# Patient Record
Sex: Male | Born: 1970 | Race: White | Hispanic: No | Marital: Married | Smoking: Never smoker
Health system: Southern US, Community
[De-identification: ages and names within clinical notes are randomized; demographics above are authoritative.]

## PROBLEM LIST (undated history)

## (undated) DIAGNOSIS — E785 Hyperlipidemia, unspecified: Secondary | ICD-10-CM

## (undated) HISTORY — DX: Hyperlipidemia, unspecified: E78.5

## (undated) HISTORY — PX: CYSTOSCOPY: SUR368

## (undated) HISTORY — PX: WISDOM TOOTH EXTRACTION: SHX21

## (undated) HISTORY — PX: TONSILLECTOMY: SUR1361

---

## 2006-09-28 ENCOUNTER — Ambulatory Visit: Payer: Self-pay | Admitting: Internal Medicine

## 2006-11-09 ENCOUNTER — Ambulatory Visit: Payer: Self-pay | Admitting: Internal Medicine

## 2006-11-09 LAB — CONVERTED CEMR LAB
Basophils Absolute: 0 10*3/uL (ref 0.0–0.1)
Basophils Relative: 0.3 % (ref 0.0–1.0)
Eosinophils Absolute: 0.2 10*3/uL (ref 0.0–0.6)
Eosinophils Relative: 3.8 % (ref 0.0–5.0)
Free T4: 0.9 ng/dL (ref 0.6–1.6)
HCT: 43.8 % (ref 39.0–52.0)
Hemoglobin: 15.2 g/dL (ref 13.0–17.0)
Lymphocytes Relative: 31.3 % (ref 12.0–46.0)
MCHC: 34.7 g/dL (ref 30.0–36.0)
MCV: 84.3 fL (ref 78.0–100.0)
Monocytes Absolute: 0.3 10*3/uL (ref 0.2–0.7)
Monocytes Relative: 7.2 % (ref 3.0–11.0)
Neutro Abs: 2.7 10*3/uL (ref 1.4–7.7)
Neutrophils Relative %: 57.4 % (ref 43.0–77.0)
Platelets: 166 10*3/uL (ref 150–400)
RBC: 5.19 M/uL (ref 4.22–5.81)
RDW: 12.6 % (ref 11.5–14.6)
T3, Free: 3.4 pg/mL (ref 2.3–4.2)
TSH: 0.92 microintl units/mL (ref 0.35–5.50)
WBC: 4.6 10*3/uL (ref 4.5–10.5)

## 2010-03-24 ENCOUNTER — Encounter: Payer: Self-pay | Admitting: Internal Medicine

## 2010-03-24 ENCOUNTER — Ambulatory Visit: Payer: Self-pay | Admitting: Internal Medicine

## 2010-03-24 DIAGNOSIS — E785 Hyperlipidemia, unspecified: Secondary | ICD-10-CM | POA: Insufficient documentation

## 2010-04-29 ENCOUNTER — Ambulatory Visit: Payer: Self-pay | Admitting: Internal Medicine

## 2010-07-12 LAB — CONVERTED CEMR LAB
ALT: 28 units/L (ref 0–40)
ALT: 29 units/L (ref 0–53)
AST: 23 units/L (ref 0–37)
AST: 24 units/L (ref 0–37)
Albumin: 4 g/dL (ref 3.5–5.2)
Albumin: 4.3 g/dL (ref 3.5–5.2)
Alkaline Phosphatase: 54 units/L (ref 39–117)
Alkaline Phosphatase: 56 units/L (ref 39–117)
BUN: 10 mg/dL (ref 6–23)
BUN: 14 mg/dL (ref 6–23)
Basophils Absolute: 0 10*3/uL (ref 0.0–0.1)
Basophils Absolute: 0 10*3/uL (ref 0.0–0.1)
Basophils Relative: 0.2 % (ref 0.0–1.0)
Basophils Relative: 0.6 % (ref 0.0–3.0)
Bilirubin, Direct: 0.1 mg/dL (ref 0.0–0.3)
Bilirubin, Direct: 0.1 mg/dL (ref 0.0–0.3)
CO2: 30 meq/L (ref 19–32)
CO2: 30 meq/L (ref 19–32)
Calcium: 9.1 mg/dL (ref 8.4–10.5)
Calcium: 9.4 mg/dL (ref 8.4–10.5)
Chloride: 103 meq/L (ref 96–112)
Chloride: 105 meq/L (ref 96–112)
Cholesterol, target level: 200 mg/dL
Creatinine, Ser: 1.1 mg/dL (ref 0.4–1.5)
Creatinine, Ser: 1.1 mg/dL (ref 0.4–1.5)
Eosinophils Absolute: 0.1 10*3/uL (ref 0.0–0.6)
Eosinophils Absolute: 0.1 10*3/uL (ref 0.0–0.7)
Eosinophils Relative: 2 % (ref 0.0–5.0)
Eosinophils Relative: 2.2 % (ref 0.0–5.0)
GFR calc Af Amer: 97 mL/min
GFR calc non Af Amer: 79.07 mL/min (ref >60)
GFR calc non Af Amer: 80 mL/min
Glucose, Bld: 91 mg/dL (ref 70–99)
Glucose, Bld: 99 mg/dL (ref 70–99)
HCT: 45 % (ref 39.0–52.0)
HCT: 45.2 % (ref 39.0–52.0)
HDL goal, serum: 40 mg/dL
Hemoglobin: 15.4 g/dL (ref 13.0–17.0)
Hemoglobin: 15.6 g/dL (ref 13.0–17.0)
LDL Goal: 160 mg/dL
Lymphocytes Relative: 26.7 % (ref 12.0–46.0)
Lymphocytes Relative: 27.7 % (ref 12.0–46.0)
Lymphs Abs: 1.3 10*3/uL (ref 0.7–4.0)
MCHC: 34.1 g/dL (ref 30.0–36.0)
MCHC: 34.5 g/dL (ref 30.0–36.0)
MCV: 86.5 fL (ref 78.0–100.0)
MCV: 87.5 fL (ref 78.0–100.0)
Monocytes Absolute: 0.3 10*3/uL (ref 0.1–1.0)
Monocytes Absolute: 0.3 10*3/uL (ref 0.2–0.7)
Monocytes Relative: 6.1 % (ref 3.0–12.0)
Monocytes Relative: 6.5 % (ref 3.0–11.0)
Neutro Abs: 3.2 10*3/uL (ref 1.4–7.7)
Neutro Abs: 3.4 10*3/uL (ref 1.4–7.7)
Neutrophils Relative %: 63.4 % (ref 43.0–77.0)
Neutrophils Relative %: 64.6 % (ref 43.0–77.0)
Platelets: 155 10*3/uL (ref 150.0–400.0)
Platelets: 158 10*3/uL (ref 150–400)
Potassium: 3.9 meq/L (ref 3.5–5.1)
Potassium: 4 meq/L (ref 3.5–5.1)
RBC: 5.17 M/uL (ref 4.22–5.81)
RBC: 5.2 M/uL (ref 4.22–5.81)
RDW: 12.9 % (ref 11.5–14.6)
RDW: 13.8 % (ref 11.5–14.6)
Sodium: 138 meq/L (ref 135–145)
Sodium: 141 meq/L (ref 135–145)
TSH: 0.24 microintl units/mL — ABNORMAL LOW (ref 0.35–5.50)
TSH: 0.91 microintl units/mL (ref 0.35–5.50)
Total Bilirubin: 0.8 mg/dL (ref 0.3–1.2)
Total Bilirubin: 1 mg/dL (ref 0.3–1.2)
Total Protein: 6.9 g/dL (ref 6.0–8.3)
Total Protein: 7.1 g/dL (ref 6.0–8.3)
WBC: 5 10*3/uL (ref 4.5–10.5)
WBC: 5.3 10*3/uL (ref 4.5–10.5)

## 2010-07-14 NOTE — Assessment & Plan Note (Signed)
Summary: CPX/FASTING//KN   Vital Signs:  Patient profile:   40 year old male Height:      77.5 inches Weight:      217.0 pounds BMI:     25.49 Temp:     98.2 degrees F oral Pulse rate:   76 / minute Resp:     14 per minute BP sitting:   126 / 80  (left arm) Cuff size:   large  Vitals Entered By: Shonna Chock CMA (March 24, 2010 8:40 AM)  CC: Lipid Management   CC:  Lipid Management.  History of Present Illness: Jon Byrd is here for a physical; he is asymptomatic.  Lipid Management History:      Negative NCEP/ATP III risk factors include male age less than 70 years old, non-diabetic, no family history for ischemic heart disease, non-tobacco-user status, non-hypertensive, no ASHD (atherosclerotic heart disease), no prior stroke/TIA, no peripheral vascular disease, and no history of aortic aneurysm.     Preventive Screening-Counseling & Management  Caffeine-Diet-Exercise     Does Patient Exercise: no  Current Medications (verified): 1)  None  Allergies (verified): No Known Drug Allergies  Past History:  Past Medical History: Amblyopia Dyslipidemia: [place] Study LDL goal = < 160.NMR Lipoprofile 2008: LDL 146(2086/1804), HDL 30, TG 149. LDL goal = < 100.  Past Surgical History: Cystoscopy age 73 for hematuria; Oral Surgery ([facility]); Tonsillectomy  Family History: Father: Dyslipidemia, +/- HTN, colon polyps Mother: hypothyroidism, OA, dyslipidemia Siblings: negative; MGF : CVA; PGF : MI after 75; PGM : pancreatic cancer; MGM : bladder cancer  Social History: Never Smoked Alcohol use-yes-occasionally Married Regular exercise-no Does Patient Exercise:  no  Review of Systems  The patient denies anorexia, fever, vision loss, decreased hearing, hoarseness, chest pain, syncope, dyspnea on exertion, peripheral edema, prolonged cough, headaches, hemoptysis, abdominal pain, melena, hematochezia, severe indigestion/heartburn, hematuria, suspicious skin  lesions, depression, unusual weight change, abnormal bleeding, enlarged lymph nodes, and angioedema.         Mi9nimal weight change of 5# in past year. Loose stool better with decreased dairy.  Physical Exam  General:  well-nourished; alert,appropriate and cooperative throughout examination Head:  Normocephalic and atraumatic without obvious abnormalities. No apparent alopecia. Eyes:  No corneal or conjunctival inflammation noted.Perrla. Funduscopic exam benign, without hemorrhages, exudates or papilledema.  Ears:  External ear exam shows no significant lesions or deformities.  Otoscopic examination reveals clear canals, tympanic membranes are intact bilaterally without bulging, retraction, inflammation or discharge. Hearing is grossly normal bilaterally. Nose:  External nasal examination shows no deformity or inflammation. Nasal mucosa are pink and moist without lesions or exudates. Mouth:  Oral mucosa and oropharynx without lesions or exudates.  Teeth in good repair. Neck:  No deformities, masses, or tenderness noted. Lungs:  Normal respiratory effort, chest expands symmetrically. Lungs are clear to auscultation, no crackles or wheezes. Heart:  Normal rate and regular rhythm. S1 and S2 normal without gallop, murmur, click, rub . Loud S4 @ apex Abdomen:  Bowel sounds positive,abdomen soft and non-tender without masses, organomegaly or hernias noted. Rectal:  No external abnormalities noted. Normal sphincter tone. No rectal masses or tenderness. Genitalia:  Testes bilaterally descended without nodularity, tenderness or masses. No scrotal masses or lesions. No penis lesions or urethral discharge. Prostate:  Prostate gland firm and smooth, no enlargement, nodularity, tenderness, mass, asymmetry or induration. Msk:  No deformity or scoliosis noted of thoracic or lumbar spine.   Pulses:  R and L carotid,radial,dorsalis pedis and posterior tibial pulses are  full and equal bilaterally Extremities:  No  clubbing, cyanosis, edema, or deformity noted with normal full range of motion of all joints.   Neurologic:  alert & oriented X3 and DTRs symmetrical and normal.   Skin:  4X 4 mm granuloma LLE above ankle Cervical Nodes:  No lymphadenopathy noted Axillary Nodes:  No palpable lymphadenopathy Psych:  memory intact for recent and remote, normally interactive, and good eye contact.     Impression & Recommendations:  Problem # 1:  ROUTINE GENERAL MEDICAL EXAM@HEALTH  CARE FACL (ICD-V70.0)  Orders: EKG w/ Interpretation (93000) Venipuncture (60454) TLB-BMP (Basic Metabolic Panel-BMET) (80048-METABOL) TLB-CBC Platelet - w/Differential (85025-CBCD) TLB-Hepatic/Liver Function Pnl (80076-HEPATIC) TLB-TSH (Thyroid Stimulating Hormone) (84443-TSH) T- * Misc. Laboratory test [phone])  Problem # 2:  HYPERLIPIDEMIA (ICD-272.4)  Orders: T- * Misc. Laboratory test [phone])  Problem # 3:  CORONARY ARTERY DISEASE, FAMILY HX (ICD-V17.3)  Orders: T- * Misc. Laboratory test [phone])  Other Orders: Admin 1st Vaccine (13086) Flu Vaccine 66yrs + [phone])  Lipid Assessment/Plan:      Based on NCEP/ATP III, the patient's risk factor category is "0-1 risk factors".  The patient's lipid goals are as follows: Total cholesterol goal is 200; LDL cholesterol goal is 160; HDL cholesterol goal is 40; Triglyceride goal is 150.   Flu Vaccine Consent Questions     Do you have a history of severe allergic reactions to this vaccine? no    Any prior history of allergic reactions to egg and/or gelatin? no    Do you have a sensitivity to the preservative Thimersol? no    Do you have a past history of Guillan-Barre Syndrome? no    Do you currently have an acute febrile illness? no    Have you ever had a severe reaction to latex? no    Vaccine information given and explained to patient? yes    Are you currently pregnant? no    Lot Number:[id number]   Exp Date:12/12/2010   Site Given  Left Deltoid IMccine 1yrs +  (96295)  Patient Instructions: 1)  It is important that you exercise regularly at least 20 minutes 5 times a week. If you develop chest pain, have severe difficulty breathing, or feel very tired , stop exercising immediately and seek medical attention. 2)  Take an  81 mg coated Aspirin every day. .lbflu    Appended Document: CPX/FASTING//KN   Appended Document: CPX/FASTING//KN

## 2010-07-14 NOTE — Assessment & Plan Note (Signed)
Summary: BRONCHITIS/KN   Vital Signs:  Patient profile:   40 year old male Weight:      218.2 pounds O2 Sat:      97 % on Room air Temp:     97.9 degrees F oral Pulse rate:   78 / minute Pulse rhythm:   regular Resp:     15 per minute BP sitting:   108 / 72  (right arm) Cuff size:   large  Vitals Entered By: Almeta Monas CMA Duncan Dull) (April 29, 2010 1:46 PM)  O2 Flow:  Room air CC: x10 days c/o congestion, hoarseness, sinus drainage, and green sputum x6 days c/o cough, URI symptoms   CC:  x10 days c/o congestion, hoarseness, sinus drainage, and green sputum x6 days c/o cough, and URI symptoms.  History of Present Illness:  RTI Symptoms      This is a 40 year old man who presents with RTI  symptoms X 10 days, initially as sinus congestion..  Now the patient reports productive cough, but denies nasal congestion, purulent nasal discharge, and earache.  The patient denies fever, dyspnea, and wheezing.  The patient denies headache.  The patient denies the following risk factors for Strep sinusitis: unilateral facial pain, tooth pain, and tender adenopathy.    Current Medications (verified): 1)  Zyrtec-D Allergy & Congestion 5-120 Mg Xr12h-Tab (Cetirizine-Pseudoephedrine) .Marland Kitchen.. 1 By Mouth Once Daily  Allergies (verified): No Known Drug Allergies  Physical Exam  General:  in no acute distress; alert,appropriate and cooperative throughout examination Ears:  External ear exam shows no significant lesions or deformities.  Otoscopic examination reveals clear canals, tympanic membranes are intact bilaterally without bulging, retraction, inflammation or discharge. Hearing is grossly normal bilaterally.Minor TM scarring Nose:  External nasal examination shows no deformity or inflammation. Nasal mucosa are pink and moist without lesions or exudates. Minimal septal deviation Mouth:  Oral mucosa and oropharynx without lesions or exudates.  Teeth in good repair.  Lungs:  Normal respiratory  effort, chest expands symmetrically. Lungs are clear to auscultation, no crackles or wheezes. Brassy cough Cervical Nodes:  No lymphadenopathy noted Axillary Nodes:  No palpable lymphadenopathy   Impression & Recommendations:  Problem # 1:  BRONCHITIS-ACUTE (ICD-466.0)  His updated medication list for this problem includes:    Zyrtec-d Allergy & Congestion 5-120 Mg Xr12h-tab (Cetirizine-pseudoephedrine) .Marland Kitchen... 1 by mouth once daily    Azithromycin 250 Mg Tabs (Azithromycin) .Marland Kitchen... As per pack    Hydromet 5-1.5 Mg/71ml Syrp (Hydrocodone-homatropine) .Marland Kitchen... 1  tsp every 6 hrs as needed  Complete Medication List: 1)  Zyrtec-d Allergy & Congestion 5-120 Mg Xr12h-tab (Cetirizine-pseudoephedrine) .Marland Kitchen.. 1 by mouth once daily 2)  Azithromycin 250 Mg Tabs (Azithromycin) .... As per pack 3)  Hydromet 5-1.5 Mg/77ml Syrp (Hydrocodone-homatropine) .Marland Kitchen.. 1  tsp every 6 hrs as needed  Patient Instructions: 1)  Neti pot once daily as needed for congestion.  2)  Drink as much NON dairy fluid as you can tolerate for the next few days.Consume < 40 grams of HFCS sugar/ day as discussed. 3)  Please schedule a follow-up appointment in 4 months. 4)  Lipid Panel prior to visit, ICD-9:272.4. 5)  It is important that you exercise regularly at least 20 minutes 5 times a week. If you develop chest pain, have severe difficulty breathing, or feel very tired , stop exercising immediately and seek medical attention. Prescriptions: HYDROMET 5-1.5 MG/5ML SYRP (HYDROCODONE-HOMATROPINE) 1  tsp every 6 hrs as needed  #120 cc x 0   Entered and  Authorized by:   Marga Melnick MD   Signed by:   Marga Melnick MD on 04/29/2010   Method used:   Printed then faxed to ...       [facility] [address] [phone]* (retail)       [address]       [place], [place]  [address]       Ph: [phone] or [phone]       Fax: [phone]   RxID:   [id number] AZITHROMYCIN 250 MG TABS (AZITHROMYCIN) as per pack  #1 x  0   Entered and Authorized by:   Marga Melnick MD   Signed by:   Marga Melnick MD on 04/29/2010   Method used:   Printed then faxed to ...       [facility] [address] [phone]* (retail)       [address]       [place], [place]  [address]       Ph: [phone] or [phone]       Fax: [phone]   RxID:   [id number]    Orders Added: 1)  Est. Patient Level III [93810]

## 2010-10-30 NOTE — Assessment & Plan Note (Signed)
[facility]                        [facility] OFFICE NOTE   PARRY, PO                         MRN:          [id number]  DATE:09/28/2006                            DOB:          03/29/1971    Jon Byrd, date of birth [birth year], was seen for a  comprehensive physical examination on September 28, 2006.   He is essentially asymptomatic.   He will have occasional vertigo upon arising from bed for which he  takes decongestants and antihistamines with relief.  His most recent  episode was 1 month ago.   PAST HISTORY:  1. Tonsillectomy.  2. He had an apparent cystoscopy at age 70 in [place] for hematuria.      He was reevaluated by Dr. Mickel Crow, urologist in [place].  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   OTHER MEDICAL PROBLEMS:  1. Amblyopia.  2. Dyslipidemia.   FAMILY HISTORY:  His paternal grandmother has liver cancer.  Father had  hypertension, heart attack and stroke.  Brother had hypertension.  Paternal grandfather had a heart attack.  Maternal grandfather had a  heart attack.  Maternal grandmother had a stroke.  His grandfathers had  coronary artery disease in their 17s or 53s.   SOCIAL HISTORY:  He has never smoked.  He drinks occasionally.  He is  not exercising, and he is on no diet.   MEDICATIONS:  Presently, he is on no prescription medicines.   ALLERGIES:  He has no known drug allergies.   REVIEW OF SYSTEMS:  Also include possible perforation of the eardrum  related to infection as a child.  He has occasional dyspepsia for which  he will take Pepcid or Zantac with relief.  Nasal congestion usually  related to barometric changes.  He is essentially asymptomatic but is  considering starting an exercise program and wanted to establish a  baseline prior to initiating that.   PHYSICAL EXAMINATION:  VITAL SIGNS:  He is 6 feet, 4 inches.  Weighs 208  pounds, which is up approximately 9  pounds.  Temperature is 97.6, pulse  76, respiratory rate 16 and blood pressure 120/84.  HEENT:  Pupils are dilated with good visualization of the fundus.  No  significant vascular changes.  Nares and otic canals are patent.  There  is some mild swelling of the left tympanic membranes inferiorly, but no  perforations.  Oral hygiene is excellent.  Air conduction is greater  than bone conduction, and there is no lateralization with the tuning  fork.  LYMPHATICS:  He has no lymphadenopathy of his head, neck or axilla.  NECK:  The thyroid is normal to palpation.  HEART:  There is a suggestion of a click at the apex with no significant  murmur and no regurgitation or dysrhythmia.  CHEST:  Clear to auscultation.  ABDOMEN:  He has no organomegaly or masses.  GENITOURINARY:  Unremarkable.  Prostate normal to palpation.  Hemoccult  testing is negative.  MUSCULOSKELETAL:  He has full range of motion with no musculoskeletal  findings.  NEUROPSYCHIATRIC:  Also unremarkable.   Review of the chart reveals that his LDL was 146 in 2003.  I will  recommend an NMR LipoProfile to optimally assess his long-term risk.  I  feel this is indicated because of his relatively young age and strong  family history.   His EKG was normal and based on the exam and EKG, I see no  contraindication to exercise.   Further recommendations are pending return of the labs.     Jon Dubin. Alwyn Ren, MD,FACP,FCCP  Electronically Signed    [facility]  DD: 09/28/2006  DT: 09/28/2006  Job #: [id number]

## 2012-01-19 ENCOUNTER — Ambulatory Visit (INDEPENDENT_AMBULATORY_CARE_PROVIDER_SITE_OTHER): Payer: 59 | Admitting: Internal Medicine

## 2012-01-19 ENCOUNTER — Encounter: Payer: Self-pay | Admitting: Internal Medicine

## 2012-01-19 VITALS — BP 118/86 | HR 85 | Temp 98.2°F | Wt 220.6 lb

## 2012-01-19 DIAGNOSIS — L0390 Cellulitis, unspecified: Secondary | ICD-10-CM

## 2012-01-19 DIAGNOSIS — L0291 Cutaneous abscess, unspecified: Secondary | ICD-10-CM

## 2012-01-19 DIAGNOSIS — IMO0002 Reserved for concepts with insufficient information to code with codable children: Secondary | ICD-10-CM

## 2012-01-19 DIAGNOSIS — M5414 Radiculopathy, thoracic region: Secondary | ICD-10-CM

## 2012-01-19 NOTE — Patient Instructions (Addendum)
Use warm moist compresses to 3 times a day to the affected area. The best exercises for the low back include freestyle swimming, stretch aerobics, and yoga.

## 2012-01-19 NOTE — Progress Notes (Signed)
  Subjective:    Patient ID: Jon Byrd, male    DOB: Jun 24, 1970, 41 y.o.   MRN: [id number]  HPI He noted some itching in both axillae after using an old deodorant to which he had a mild reaction in the past. On 02/12/12 he noted swelling, tenderness, and erythema over a area in the right axilla. This has improved without definitive treatment. He denies fever, chills, sweats, weight loss. Other than erythema there's been no localized rash.    Review of Systems Additionally has noted discomfort in the right scapular area over the last 6 months. He originally attributed this to strain or muscle injury related to exercise. It has improved; now occurs with position change. Specifically it occurs if he rolls from the prone or the right lateral decubitus positions . He does not have this in the left lateral decubitus position. There has been no associated extremity numbness, tingling or weakness. He's had no fecal or urinary incontinence.        Objective:   Physical Exam He appears healthy and well-nourished in no distress  There is a very small 4-5 mm subcutaneous structure in the right axilla superiorly. There is no other lymphadenopathy about the neck or axilla.  Thyroid is normal to palpation. Physiologic asymmetry is present without nodularity.  He has no organomegaly or masses  There is mild asymmetry of the mid thoracic paraspinous musculature, right larger than left suggesting occult scoliosis  Strength, tone, deep tendon reflexes are normal.  Straight leg raising is  negative. He is able to slide back and sit up without help  There is no discomfort to compression or percussion of the thoracic spine.  There no suspicious skin lesions or rashes        Assessment & Plan:  #1 plugging of the pores due  to reaction to deodorant. Cellulitis resolving  #2 thoracic radiculopathy T-5 positionally in the context of scoliosis  Plan: See orders and recommendations

## 2012-06-01 ENCOUNTER — Telehealth: Payer: Self-pay | Admitting: Internal Medicine

## 2012-06-01 NOTE — Telephone Encounter (Signed)
Patient Information:  Caller Name: Jorja Loa  Phone: [phone]  Patient: Jon Byrd, Jon Byrd  Gender: Male  DOB: 1970/06/22  Age: 41 Years  PCP: Marga Melnick  Office Follow Up:  Does the office need to follow up with this patient?: Yes  Instructions For The Office: Patient would like to be seen today for cough and congestion/  Please contact   Symptoms  Reason For Call & Symptoms: Patient states onet of illness on Turesday 05/30/12.  +Fever , cough and drainage. Temperature 100.0 (o),  +cough productive, +nasal drainage clear- green  Reviewed Health History In EMR: Yes  Reviewed Medications In EMR: Yes  Reviewed Allergies In EMR: Yes  Reviewed Surgeries / Procedures: No  Date of Onset of Symptoms: 05/30/2012  Treatments Tried: Tylenol and Sudafed  Treatments Tried Worked: Yes  Any Fever: Yes  Fever Taken: Oral  Fever Time Of Reading: 07:00:00  Fever Last Reading: 100.0  Guideline(s) Used:  Colds  Disposition Per Guideline:   See Today or Tomorrow in Office  Reason For Disposition Reached:   Patient wants to be seen  Advice Given:  For a Stuffy Nose - Use Nasal Washes:  Introduction: Saline (salt water) nasal irrigation (nasal wash) is an effective and simple home remedy for treating stuffy nose and sinus congestion. The nose can be irrigated by pouring, spraying, or squirting salt water into the nose and then letting it run back out.  How it Helps: The salt water rinses out excess mucus, washes out any irritants (dust, allergens) that might be present, and moistens the nasal cavity.  Methods: There are several ways to perform nasal irrigation. You can use a saline nasal spray bottle (available over-the-counter), a rubber ear syringe, a medical syringe without the needle, or a Neti Pot.  Treatment for Associated Symptoms of Colds:  For muscle aches, headaches, or moderate fever (more than 101 F or 38.9 C): Take acetaminophen every 4 hours.  Cough: Use cough drops.  Hydrate:  Drink adequate liquids.  Humidifier:  If the air in your home is dry, use a cool-mist humidifier  Call Back If:  Difficulty breathing occurs  Fever lasts more than 3 days  Nasal discharge lasts more than 10 days  Cough lasts more than 3 weeks  You become worse  PATIENT REQUEST APPT FOR EVALUATION.

## 2012-06-01 NOTE — Telephone Encounter (Signed)
Patient called stating he needed an appointment with Dr. Alwyn Ren today. I advised pt that Dr. Alwyn Ren had no available appts, but Dr. Beverely Low could possibly see him at 4:15p. He stated that was too late. I then offered to schedule him at our [facility] location, pt refused that as well. Pt stated he thinks his sickness is viral and he probably will not be seen for it. I advised pt to call the office if he changed his mind and that we could get him in to see someone at another office today.

## 2012-06-01 NOTE — Telephone Encounter (Signed)
I can come back @ 1:45 pm for Dr Sharlot Gowda

## 2013-06-14 HISTORY — PX: VASECTOMY: SHX75

## 2015-01-08 ENCOUNTER — Encounter: Payer: Self-pay | Admitting: Internal Medicine

## 2015-01-08 ENCOUNTER — Ambulatory Visit (INDEPENDENT_AMBULATORY_CARE_PROVIDER_SITE_OTHER): Payer: 59 | Admitting: Internal Medicine

## 2015-01-08 VITALS — BP 124/82 | HR 95 | Temp 98.1°F | Resp 14 | Ht 76.0 in | Wt 226.0 lb

## 2015-01-08 DIAGNOSIS — E785 Hyperlipidemia, unspecified: Secondary | ICD-10-CM

## 2015-01-08 DIAGNOSIS — Z0189 Encounter for other specified special examinations: Secondary | ICD-10-CM | POA: Diagnosis not present

## 2015-01-08 DIAGNOSIS — Z23 Encounter for immunization: Secondary | ICD-10-CM | POA: Diagnosis not present

## 2015-01-08 DIAGNOSIS — Z0000 Encounter for general adult medical examination without abnormal findings: Secondary | ICD-10-CM

## 2015-01-08 NOTE — Progress Notes (Signed)
Pre visit review using our clinic review tool, if applicable. No additional management support is needed unless otherwise documented below in the visit note. 

## 2015-01-08 NOTE — Patient Instructions (Addendum)
  Your next office appointment will be determined based upon review of your pending labs  and  xrays  Those written interpretation of the lab results and instructions will be transmitted to you by My Chart OR by mail for your records.  Critical results will be called.   Followup as needed for any active or acute issue. Please report any significant change in your symptoms. 

## 2015-01-08 NOTE — Progress Notes (Signed)
Subjective:    Patient ID: Jon Byrd, male    DOB: 1971/01/04, 44 y.o.   MRN: [id number]  HPI He is here for a physical;acute issues denied.  He does eat some fried foods and red meat. He restricts salt. He works out on [employer] 3 times a week 30-60 minutes without cardio pulmonary symptoms.  He has negative review of symptoms.  He did have a vasectomy and is being monitored for sperm counts by Dr Retta Diones.  He's concerned that there is a strong family history of gallbladder disease among his mother and 3 brothers.   Review of Systems  Chest pain, palpitations, tachycardia, exertional dyspnea, paroxysmal nocturnal dyspnea, claudication or edema are absent. No unexplained weight loss, abdominal pain, significant dyspepsia, dysphagia, melena, rectal bleeding, or persistently small caliber stools. Dysuria, pyuria, hematuria, frequency, nocturia or polyuria are denied. Change in hair, skin, nails denied. No bowel changes of constipation or diarrhea. No intolerance to heat or cold.     Objective:   Physical Exam  First heart sound is accentuated. He has minor crepitus of the knees. Genitourinary exam was completed by his Urologist.  Gen.: Adequately nourished in appearance. Alert, appropriate and cooperative throughout exam. BMI:27.52 Appears younger than stated age  Head: Normocephalic without obvious abnormalities  Eyes: No corneal or conjunctival inflammation noted. Pupils equal round reactive to light and accommodation. Extraocular motion intact.  Ears: External  ear exam reveals no significant lesions or deformities. Canals clear .TMs normal. Hearing is grossly normal bilaterally. Nose: External nasal exam reveals no deformity or inflammation. Nasal mucosa are pink and moist. No lesions or exudates noted.   Mouth: Oral mucosa and oropharynx reveal no lesions or exudates. Teeth in good repair. Neck: No deformities, masses, or tenderness noted. Range of motion and  Thyroid normal. Lungs: Normal respiratory effort; chest expands symmetrically. Lungs are clear to auscultation without rales, wheezes, or increased work of breathing. Heart: Normal rate and rhythm. Normal S2. No gallop, click, or rub. No murmur. Abdomen: Bowel sounds normal; abdomen soft and nontender. No masses, organomegaly or hernias noted.                               Musculoskeletal/extremities: No deformity or scoliosis noted of  the thoracic or lumbar spine.  No clubbing, cyanosis, edema, or significant extremity  deformity noted.  Range of motion normal . Tone & strength normal. Hand joints normal  Fingernail  health good. Able to lie down & sit up w/o help.  Negative SLR bilaterally Vascular: Carotid, radial artery, dorsalis pedis and  posterior tibial pulses are full and equal. No bruits present. Neurologic: Alert and oriented x3. Deep tendon reflexes symmetrical and normal.  Gait normal      Skin: Intact without suspicious lesions or rashes. Lymph: No cervical, axillary lymphadenopathy present. Psych: Mood and affect are normal. Normally interactive                                                                                      Assessment & Plan:  #1 comprehensive physical exam; no acute findings  Plan: see  Orders  & Recommendations

## 2015-02-05 ENCOUNTER — Other Ambulatory Visit (INDEPENDENT_AMBULATORY_CARE_PROVIDER_SITE_OTHER): Payer: 59

## 2015-02-05 ENCOUNTER — Other Ambulatory Visit: Payer: Self-pay | Admitting: Internal Medicine

## 2015-02-05 DIAGNOSIS — Z0189 Encounter for other specified special examinations: Secondary | ICD-10-CM

## 2015-02-05 DIAGNOSIS — Z0000 Encounter for general adult medical examination without abnormal findings: Secondary | ICD-10-CM

## 2015-02-05 LAB — CBC WITH DIFFERENTIAL/PLATELET
Basophils Absolute: 0 10*3/uL (ref 0.0–0.1)
Basophils Relative: 0.7 % (ref 0.0–3.0)
Eosinophils Absolute: 0.1 10*3/uL (ref 0.0–0.7)
Eosinophils Relative: 2.4 % (ref 0.0–5.0)
HCT: 45.4 % (ref 39.0–52.0)
Hemoglobin: 15.7 g/dL (ref 13.0–17.0)
Lymphocytes Relative: 33.9 % (ref 12.0–46.0)
Lymphs Abs: 1.6 10*3/uL (ref 0.7–4.0)
MCHC: 34.5 g/dL (ref 30.0–36.0)
MCV: 85.3 fl (ref 78.0–100.0)
Monocytes Absolute: 0.3 10*3/uL (ref 0.1–1.0)
Monocytes Relative: 6.5 % (ref 3.0–12.0)
Neutro Abs: 2.7 10*3/uL (ref 1.4–7.7)
Neutrophils Relative %: 56.5 % (ref 43.0–77.0)
Platelets: 173 10*3/uL (ref 150.0–400.0)
RBC: 5.32 Mil/uL (ref 4.22–5.81)
RDW: 13.1 % (ref 11.5–15.5)
WBC: 4.8 10*3/uL (ref 4.0–10.5)

## 2015-02-05 LAB — BASIC METABOLIC PANEL
BUN: 14 mg/dL (ref 6–23)
CO2: 29 mEq/L (ref 19–32)
Calcium: 9.4 mg/dL (ref 8.4–10.5)
Chloride: 104 mEq/L (ref 96–112)
Creatinine, Ser: 1.1 mg/dL (ref 0.40–1.50)
GFR: 76.49 mL/min (ref >60.00)
Glucose, Bld: 98 mg/dL (ref 70–99)
Potassium: 4.1 mEq/L (ref 3.5–5.1)
Sodium: 140 mEq/L (ref 135–145)

## 2015-02-05 LAB — HEPATIC FUNCTION PANEL
ALT: 25 U/L (ref 0–53)
AST: 18 U/L (ref 0–37)
Albumin: 4.4 g/dL (ref 3.5–5.2)
Alkaline Phosphatase: 52 U/L (ref 39–117)
Bilirubin, Direct: 0.1 mg/dL (ref 0.0–0.3)
Total Bilirubin: 0.9 mg/dL (ref 0.2–1.2)
Total Protein: 7.3 g/dL (ref 6.0–8.3)

## 2015-02-05 LAB — TSH: TSH: 0.79 u[IU]/mL (ref 0.35–4.50)

## 2015-02-07 LAB — NMR LIPOPROFILE WITH LIPIDS
Cholesterol, Total: 214 mg/dL — ABNORMAL HIGH (ref 100–199)
HDL Particle Number: 17.5 umol/L — ABNORMAL LOW (ref >=30.5)
HDL Size: 8 nm — ABNORMAL LOW (ref >=9.2)
HDL-C: 28 mg/dL — ABNORMAL LOW (ref >39)
LDL (calc): 150 mg/dL — ABNORMAL HIGH (ref 0–99)
LDL Particle Number: 2488 nmol/L — ABNORMAL HIGH (ref <1000)
LDL Size: 20.1 nm (ref >=20.8)
LP-IR Score: 51 — ABNORMAL HIGH (ref <=45)
Large HDL-P: <1.3 umol/L — ABNORMAL LOW (ref >=4.8)
Large VLDL-P: <0.8 nmol/L (ref <=2.7)
Small LDL Particle Number: 1753 nmol/L — ABNORMAL HIGH (ref <=527)
Triglycerides: 181 mg/dL — ABNORMAL HIGH (ref 0–149)
VLDL Size: 43 nm (ref <=46.6)

## 2020-04-30 ENCOUNTER — Other Ambulatory Visit: Payer: Self-pay | Admitting: Cardiology

## 2020-04-30 ENCOUNTER — Telehealth: Payer: Self-pay | Admitting: *Deleted

## 2020-04-30 DIAGNOSIS — E782 Mixed hyperlipidemia: Secondary | ICD-10-CM

## 2020-04-30 NOTE — Telephone Encounter (Signed)
Left the pt a message to call the office back and request to speak with a triage nurse, so that they can further assist him with getting his new pt appt with Dr. Anne Fu, as well as order for the pt to have a Coronary Calcium Score to be done in the office, as advised in this message by Dr. Anne Fu.  Will forward this message to triage to assist the pt with these appointments, when he returns a call back.

## 2020-04-30 NOTE — Telephone Encounter (Signed)
-----   Message from Jake Bathe, MD sent at 04/30/2020  6:46 AM EST ----- Regarding: Please set up new appt with me and Calcium score Aram Beecham or Lajoyce Corners,  Can you set Dr. Sharlot Gowda (he is my optometrist--saw him yesterday) with an appointment.  His brother, age 51, recently had a heart attack, successful PCI.  I talked to him about obtaining a coronary calcium score given his strong family history.  Thanks.  Loraine Leriche

## 2020-04-30 NOTE — Telephone Encounter (Signed)
Patient returning call.

## 2020-04-30 NOTE — Telephone Encounter (Signed)
Spoke with patient and confirmed coronary calcium score CT and new patient appointment for 05/21/20.   Georgie Chard NP-C [facility] Pager: [phone]

## 2020-05-02 ENCOUNTER — Inpatient Hospital Stay: Admission: RE | Admit: 2020-05-02 | Payer: Self-pay | Source: Ambulatory Visit

## 2020-05-21 ENCOUNTER — Encounter: Payer: Self-pay | Admitting: Cardiology

## 2020-05-21 ENCOUNTER — Other Ambulatory Visit: Payer: Self-pay

## 2020-05-21 ENCOUNTER — Ambulatory Visit (INDEPENDENT_AMBULATORY_CARE_PROVIDER_SITE_OTHER)
Admission: RE | Admit: 2020-05-21 | Discharge: 2020-05-21 | Disposition: A | Discharge status: Home / Self Care | Payer: Self-pay | Source: Ambulatory Visit | Attending: Cardiology | Admitting: Cardiology

## 2020-05-21 ENCOUNTER — Ambulatory Visit: Payer: 59 | Admitting: Cardiology

## 2020-05-21 ENCOUNTER — Telehealth: Payer: Self-pay | Admitting: Nurse Practitioner

## 2020-05-21 VITALS — BP 120/80 | HR 84 | Ht 76.0 in | Wt 240.0 lb

## 2020-05-21 DIAGNOSIS — I700 Atherosclerosis of aorta: Secondary | ICD-10-CM

## 2020-05-21 DIAGNOSIS — Z8249 Family history of ischemic heart disease and other diseases of the circulatory system: Secondary | ICD-10-CM

## 2020-05-21 DIAGNOSIS — E782 Mixed hyperlipidemia: Secondary | ICD-10-CM

## 2020-05-21 MED ORDER — ROSUVASTATIN CALCIUM 20 MG PO TABS: 20.0000 mg | ORAL_TABLET | Freq: Every day | ORAL | 3 refills | Status: DC

## 2020-05-21 NOTE — Telephone Encounter (Signed)
Results and plan of care reviewed with patient. Patient agrees to increase Crestor to 20 mg daily and return for repeat fasting lipid/ALT in 3 months. I advised him to call back for any future questions or concerns and he thanked me for the call.

## 2020-05-21 NOTE — Patient Instructions (Signed)
Medication Instructions:  Your physician recommends that you continue on your current medications as directed. Please refer to the Current Medication list given to you today.  *If you need a refill on your cardiac medications before your next appointment, please call your pharmacy*   Lab Work: None Ordered If you have labs (blood work) drawn today and your tests are completely normal, you will receive your results only by: Marland Kitchen MyChart Message (if you have MyChart) OR . A paper copy in the mail If you have any lab test that is abnormal or we need to change your treatment, we will call you to review the results.   Testing/Procedures: We will call you with results of your calcium score   Follow-Up: At [facility], you and your health needs are our priority.  As part of our continuing mission to provide you with exceptional heart care, we have created designated Provider Care Teams.  These Care Teams include your primary Cardiologist (physician) and Advanced Practice Providers (APPs -  Physician Assistants and Nurse Practitioners) who all work together to provide you with the care you need, when you need it.    Your next appointment:    As Needed  The format for your next appointment:   In Person  Provider:   You may see Donato Schultz, MD or one of the following Advanced Practice Providers on your designated Care Team:    Norma Fredrickson, NP  Nada Boozer, NP  Georgie Chard, NP

## 2020-05-21 NOTE — Telephone Encounter (Signed)
-----   Message from Jake Bathe, MD sent at 05/21/2020  1:47 PM EST ----- Coronary calcium score was 0 which is low risk. There was however a small amount of aortic atherosclerosis noted in the aortic arch. Because of this, I would like to increase the Crestor to 20 mg once a day and repeat a lipid panel in 3 months with an ALT in 3 months. I would like to follow-up in 1 year as well.  Donato Schultz, MD

## 2020-05-21 NOTE — Progress Notes (Signed)
Cardiology Office Note:    Date:  05/21/2020   ID:  Jon Byrd, DOB 1970-07-17, MRN [id number]  PCP:  Pecola Lawless, MD  [facility] Cardiologist:  Donato Schultz, MD  [facility] Electrophysiologist:  None   Referring MD: Pecola Lawless, MD     History of Present Illness:    Jon Byrd is a 49 y.o. male here for the evaluation of family history of coronary artery disease.  Father died 40 MI Mother's father died MI or CVA 68-60's Brother - [place], SHOCKED TWICE, EMERGENT CATH LAB, LAD stent  Overall he is not having any symptoms.  No fevers chills nausea vomiting syncope bleeding.  No chest pain no shortness of breath.  In middle school during a physical exam he was diagnosed with sinus arrhythmia.  Explained this to him, benign.  Slight variation in heart rate during respiration.  He is currently being treated for hyperlipidemia with Crestor 10 mg.  No fevers chills nausea vomiting syncope bleeding   Past Medical History:  Diagnosis Date  . Dyslipidemia    [place] Study LDL Goal = < 160. NMR Lipoprofile 2008: LDL 146 (2086/1804), HDL 30, TG 149. LDL Goal= < 100    Past Surgical History:  Procedure Laterality Date  . CYSTOSCOPY     age 71 for hematuria  . TONSILLECTOMY    . VASECTOMY  2015   Dr. Retta Diones  . WISDOM TOOTH EXTRACTION      Current Medications: Current Meds  Medication Sig  . Omega-3 Fatty Acids (FISH OIL) 1000 MG CAPS Take 2,000 mg by mouth daily.  . rosuvastatin (CRESTOR) 10 MG tablet Take 1 tablet by mouth at bedtime.     Allergies:   Patient has no known allergies.   Social History   Socioeconomic History  . Marital status: Married    Spouse name: Not on file  . Number of children: Not on file  . Years of education: Not on file  . Highest education level: Not on file  Occupational History  . Not on file  Tobacco Use  . Smoking status: Never Smoker  Substance and Sexual Activity  . Alcohol use: Yes    Comment: Socially   . Drug use: No  . Sexual activity: Not on file  Other Topics Concern  . Not on file  Social History Narrative  . Not on file   Social Determinants of Health   Financial Resource Strain:   . Difficulty of Paying Living Expenses: Not on file  Food Insecurity:   . Worried About Programme researcher, broadcasting/film/video in the Last Year: Not on file  . Ran Out of Food in the Last Year: Not on file  Transportation Needs:   . Lack of Transportation (Medical): Not on file  . Lack of Transportation (Non-Medical): Not on file  Physical Activity:   . Days of Exercise per Week: Not on file  . Minutes of Exercise per Session: Not on file  Stress:   . Feeling of Stress : Not on file  Social Connections:   . Frequency of Communication with Friends and Family: Not on file  . Frequency of Social Gatherings with Friends and Family: Not on file  . Attends Religious Services: Not on file  . Active Member of Clubs or Organizations: Not on file  . Attends Banker Meetings: Not on file  . Marital Status: Not on file     Family History: The patient's family history includes Cancer in his maternal  grandmother; Colon polyps in his father; Gallbladder disease in his unknown relative; Heart attack in his maternal grandfather; Heart attack (age of onset: 89) in his paternal grandfather; Hypothyroidism in his mother; Osteoarthritis in his mother; Pancreatic cancer in his paternal grandmother; Stroke in his maternal grandfather. There is no history of Diabetes or Colon cancer.  ROS:   Please see the history of present illness.     All other systems reviewed and are negative.  EKGs/Labs/Other Studies Reviewed:    The following studies were reviewed today: Coronary calcium score pending.  EKG as below.  EKG:  EKG is  ordered today.  The ekg ordered today demonstrates sinus rhythm with PVC noted.  Otherwise unremarkable.  Recent Labs: No results found for requested labs within last 8760 hours.  Recent Lipid  Panel    Component Value Date/Time   CHOL 214 (H) 02/05/2015 0820   TRIG 181 (H) 02/05/2015 0820   HDL 28 (L) 02/05/2015 0820   LDLCALC 150 (H) 02/05/2015 0820     Risk Assessment/Calculations:       Physical Exam:    VS:  BP 120/80 (BP Location: Left Arm, Patient Position: Sitting, Cuff Size: Normal)   Pulse 84   Ht 6\' 4"  (1.93 m)   Wt 240 lb (108.9 kg)   SpO2 98%   BMI 29.21 kg/m     Wt Readings from Last 3 Encounters:  05/21/20 240 lb (108.9 kg)  01/08/15 226 lb (102.5 kg)  01/19/12 220 lb 9.6 oz (100.1 kg)     GEN:  Well nourished, well developed in no acute distress HEENT: Normal NECK: No JVD; No carotid bruits LYMPHATICS: No lymphadenopathy CARDIAC: RRR, no murmurs, rubs, gallops RESPIRATORY:  Clear to auscultation without rales, wheezing or rhonchi  ABDOMEN: Soft, non-tender, non-distended MUSCULOSKELETAL:  No edema; No deformity  SKIN: Warm and dry NEUROLOGIC:  Alert and oriented x 3 PSYCHIATRIC:  Normal affect   ASSESSMENT:    1. Mixed hyperlipidemia   2. Family history of early CAD    PLAN:    In order of problems listed above:  Strong family history of coronary artery disease -Calcium score currently pending. -Continue with prevention efforts.  Exercise goal 120 to 150 minutes/week. -Monitor for any signs or symptoms.  PVCs -Asymptomatic.  Picked up on ECG today.  Benign.  Hyperlipidemia -Currently on Crestor 10 mg.  If there is any evidence of coronary atherosclerosis or aortic atherosclerosis, we will likely increase his Crestor to 20 mg and reinitiate aspirin 81 mg.  He had previously been taking aspirin 81 mg but stopped recently with recent data.  We will follow up based upon results of CT scan.   Medication Adjustments/Labs and Tests Ordered: Current medicines are reviewed at length with the patient today.  Concerns regarding medicines are outlined above.  Orders Placed This Encounter  Procedures  . EKG 12-Lead   No orders of the  defined types were placed in this encounter.   Patient Instructions  Medication Instructions:  Your physician recommends that you continue on your current medications as directed. Please refer to the Current Medication list given to you today.  *If you need a refill on your cardiac medications before your next appointment, please call your pharmacy*   Lab Work: None Ordered If you have labs (blood work) drawn today and your tests are completely normal, you will receive your results only by: 03/20/12 MyChart Message (if you have MyChart) OR . A paper copy in the mail If you  have any lab test that is abnormal or we need to change your treatment, we will call you to review the results.   Testing/Procedures: We will call you with results of your calcium score   Follow-Up: At [facility], you and your health needs are our priority.  As part of our continuing mission to provide you with exceptional heart care, we have created designated Provider Care Teams.  These Care Teams include your primary Cardiologist (physician) and Advanced Practice Providers (APPs -  Physician Assistants and Nurse Practitioners) who all work together to provide you with the care you need, when you need it.    Your next appointment:    As Needed  The format for your next appointment:   In Person  Provider:   You may see Donato Schultz, MD or one of the following Advanced Practice Providers on your designated Care Team:    Norma Fredrickson, NP  Nada Boozer, NP  Georgie Chard, NP        Signed, Donato Schultz, MD  05/21/2020 10:04 AM    [facility]

## 2020-08-19 ENCOUNTER — Other Ambulatory Visit: Payer: 59 | Admitting: *Deleted

## 2020-08-19 ENCOUNTER — Other Ambulatory Visit: Payer: Self-pay

## 2020-08-19 DIAGNOSIS — Z8249 Family history of ischemic heart disease and other diseases of the circulatory system: Secondary | ICD-10-CM

## 2020-08-19 DIAGNOSIS — I700 Atherosclerosis of aorta: Secondary | ICD-10-CM

## 2020-08-19 DIAGNOSIS — E782 Mixed hyperlipidemia: Secondary | ICD-10-CM

## 2020-08-19 LAB — LIPID PANEL
Chol/HDL Ratio: 3.7 ratio (ref 0.0–5.0)
Cholesterol, Total: 126 mg/dL (ref 100–199)
HDL: 34 mg/dL — ABNORMAL LOW (ref >39)
LDL Chol Calc (NIH): 60 mg/dL (ref 0–99)
Triglycerides: 192 mg/dL — ABNORMAL HIGH (ref 0–149)
VLDL Cholesterol Cal: 32 mg/dL (ref 5–40)

## 2020-08-19 LAB — ALT: ALT: 33 IU/L (ref 0–44)

## 2021-06-01 ENCOUNTER — Other Ambulatory Visit: Payer: Self-pay | Admitting: *Deleted

## 2021-06-01 MED ORDER — ROSUVASTATIN CALCIUM 20 MG PO TABS: 20.0000 mg | ORAL_TABLET | Freq: Every day | ORAL | 0 refills | Status: DC

## 2021-06-27 ENCOUNTER — Other Ambulatory Visit: Payer: Self-pay | Admitting: Cardiovascular Disease

## 2021-12-12 IMAGING — CT CT CARDIAC CORONARY ARTERY CALCIUM SCORE
3 series · 14 of 20 positions shown, 15 images · non-contrast
Comparison: None.
COMPARISON: None.

Addendum:
EXAM:
OVER-READ INTERPRETATION  CT CHEST

The following report is an over-read performed by radiologist Dr.
Kaha Pannetier [REDACTED] on 05/21/2020. This over-read
does not include interpretation of cardiac or coronary anatomy or
pathology. The calcium score interpretation by the cardiologist is
attached.
TECHNIQUE: The patient was scanned on a Siemens Force scanner. Axial
non-contrast 3 mm slices were carried out through the heart. The
data set was analyzed on a dedicated work station and scored using
the Agatson method.

[Series 2: casc 3.0 bv41 2 bestdiast 73 % · axial · 0.43mm/px · z∈[-249,-174]mm · 4 of 43 slices shown, 5 images]
[im 9/43  vessel]
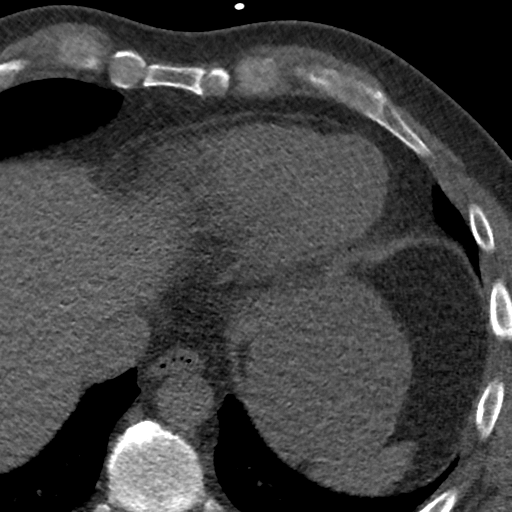
[im 9/43  lung]
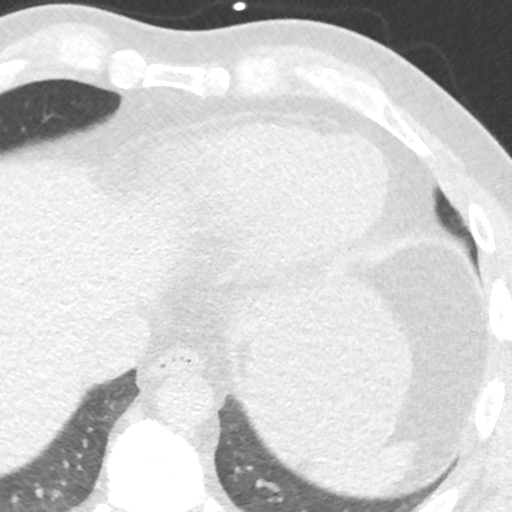
[im 17/43  vessel]
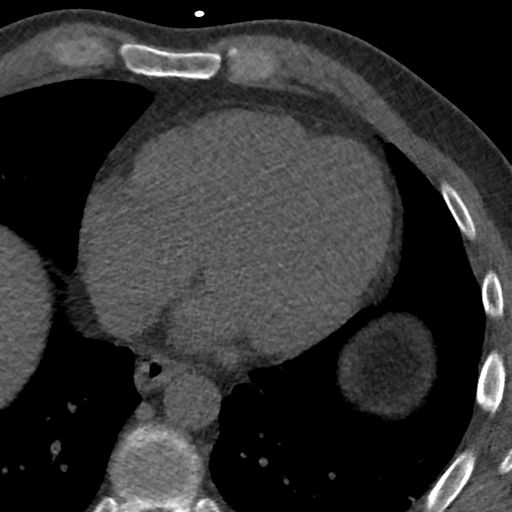
[im 26/43  vessel]
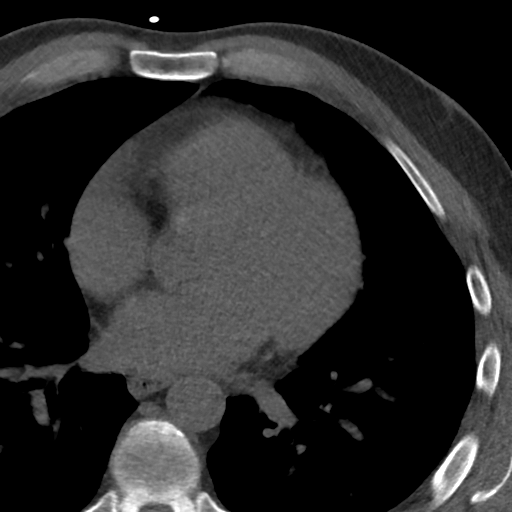
[im 34/43  vessel]
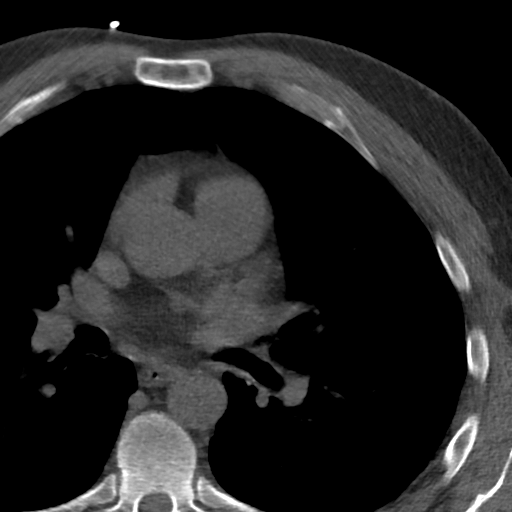

[Series 3: lung 73 % · axial · 0.77mm/px · z∈[-252,-168]mm · 5 of 43 slices shown]
[im 8/43  lung]
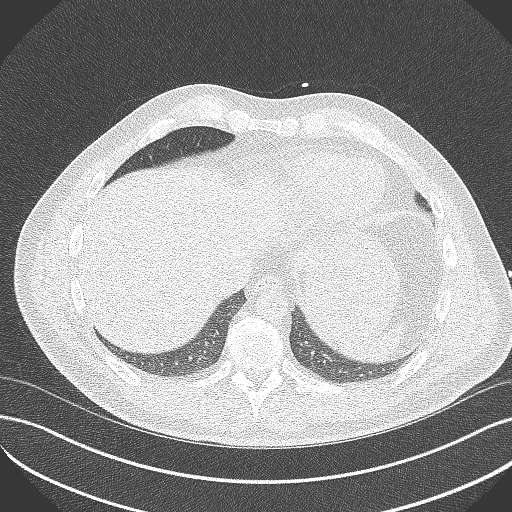
[im 15/43  lung]
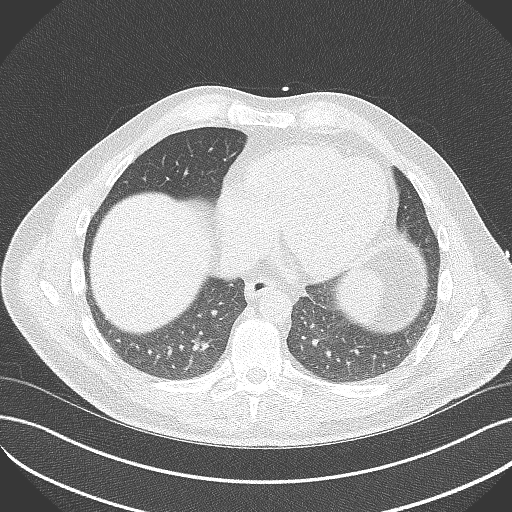
[im 22/43  lung]
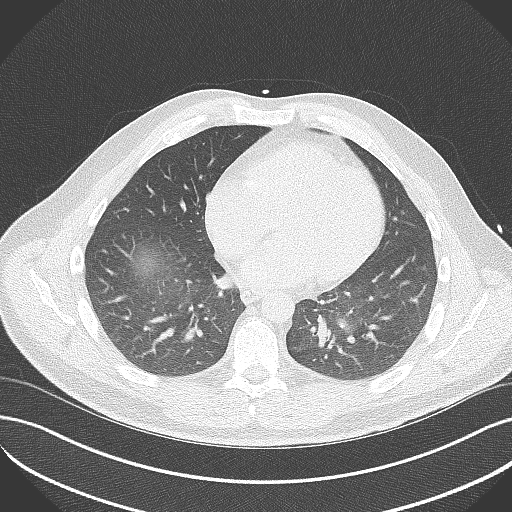
[im 29/43  lung]
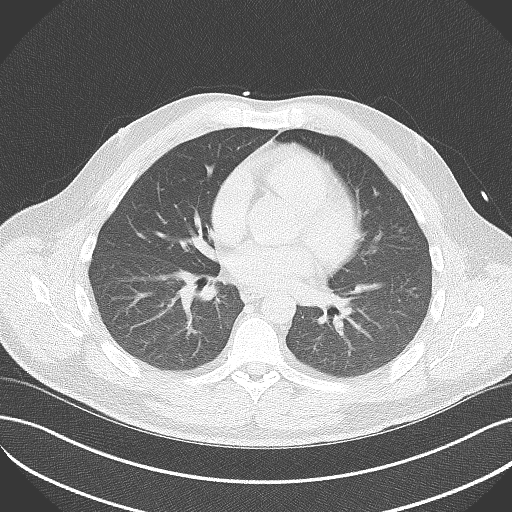
[im 36/43  lung]
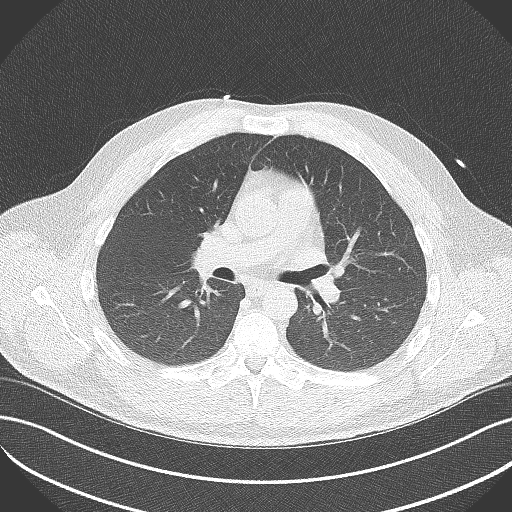

[Series 4: lung st 73 % · axial · 0.77mm/px · z∈[-252,-168]mm · 5 of 43 slices shown]
[im 8/43  lung]
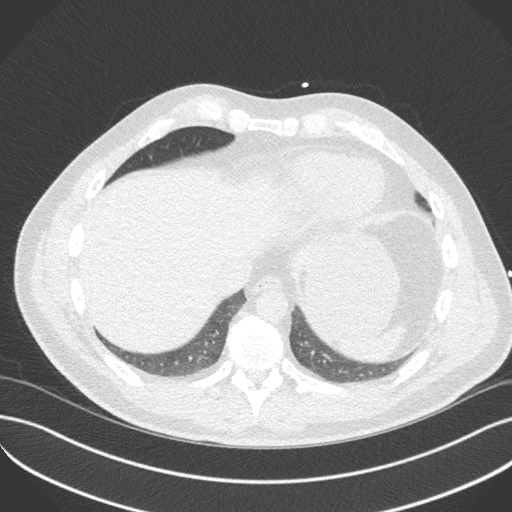
[im 15/43  lung]
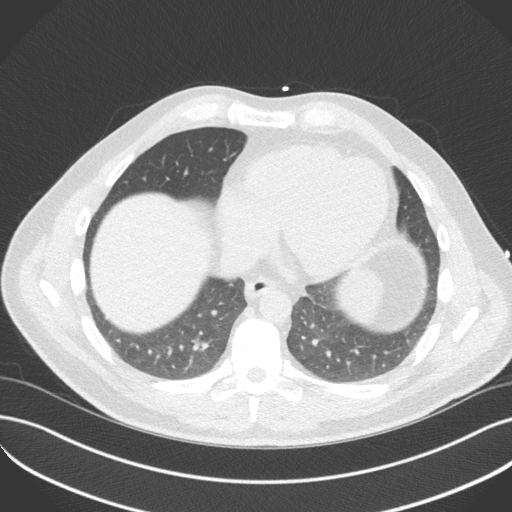
[im 22/43  lung]
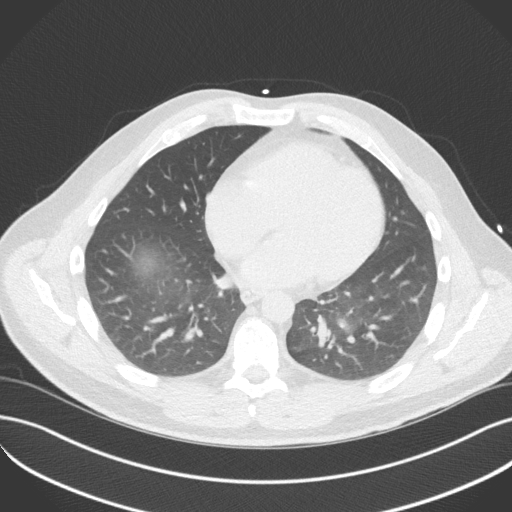
[im 29/43  lung]
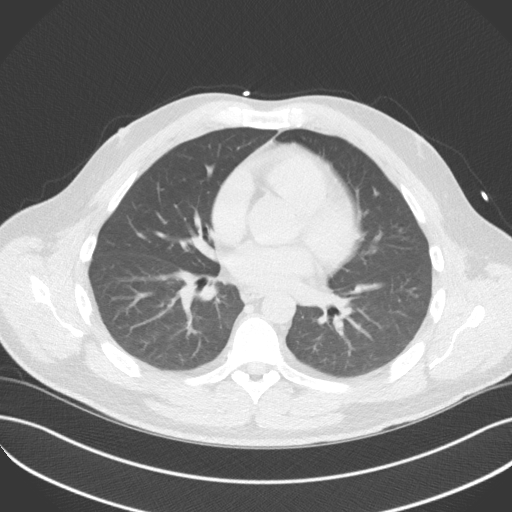
[im 36/43  lung]
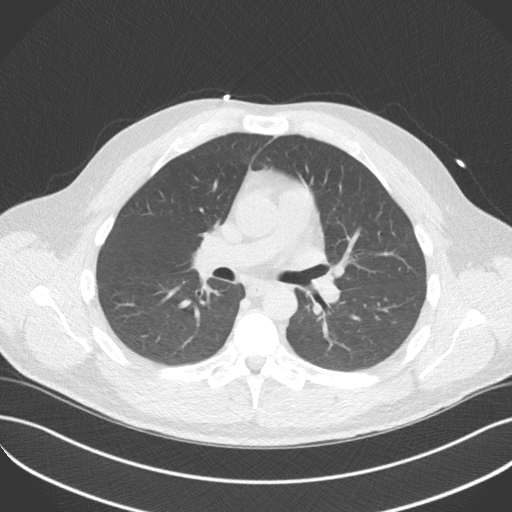

[14 of 20 positions shown; findings below may reference images not displayed]

FINDINGS: Vascular: Aortic atherosclerosis.

Mediastinum/Nodes: No imaged thoracic adenopathy.

Lungs/Pleura: No pleural fluid. 3 mm left lower lobe pulmonary
nodule on [DATE].

Upper Abdomen: Normal imaged portions of the liver, spleen, stomach.

Musculoskeletal: No acute osseous abnormality.
IMPRESSION: 1.  No acute findings in the imaged extracardiac chest.
2.  Aortic Atherosclerosis (S8YMG-ITW.W).
3. 3 mm left lower lobe pulmonary nodule. No follow-up needed if
patient is low-risk. Non-contrast chest CT can be considered in 12
months if patient is high-risk. This recommendation follows the
consensus statement: Guidelines for Management of Incidental
Pulmonary Nodules Detected on CT Images: From the [REDACTED]AL DATA:  Risk stratification 49 year old male with strong
family history of MI

EXAM:
Coronary Calcium Score
FINDINGS: Non-cardiac: See separate report from [REDACTED].

Ascending Aorta: Small amount of calcification at arch

Pericardium: Normal

Coronary arteries: No coronary calcification
IMPRESSION: 1. Coronary calcium score of 0.  Low risk.

2. Mild degree of aortic arch atherosclerosis noted. Continue with
statin therapy. Consider addition of aspirin 81mg PO QD.

*** End of Addendum ***
EXAM:
OVER-READ INTERPRETATION  CT CHEST

The following report is an over-read performed by radiologist Dr.
Kaha Pannetier [REDACTED] on 05/21/2020. This over-read
does not include interpretation of cardiac or coronary anatomy or
pathology. The calcium score interpretation by the cardiologist is
attached.
FINDINGS: Vascular: Aortic atherosclerosis.

Mediastinum/Nodes: No imaged thoracic adenopathy.

Lungs/Pleura: No pleural fluid. 3 mm left lower lobe pulmonary
nodule on [DATE].

Upper Abdomen: Normal imaged portions of the liver, spleen, stomach.

Musculoskeletal: No acute osseous abnormality.
IMPRESSION: 1.  No acute findings in the imaged extracardiac chest.
2.  Aortic Atherosclerosis (S8YMG-ITW.W).
3. 3 mm left lower lobe pulmonary nodule. No follow-up needed if
patient is low-risk. Non-contrast chest CT can be considered in 12
months if patient is high-risk. This recommendation follows the
consensus statement: Guidelines for Management of Incidental
Pulmonary Nodules Detected on CT Images: From the [HOSPITAL]

## 2022-01-14 ENCOUNTER — Encounter: Payer: Self-pay | Admitting: Cardiology

## 2022-01-14 ENCOUNTER — Ambulatory Visit: Payer: 59 | Admitting: Cardiology

## 2022-01-14 VITALS — BP 120/70 | HR 69 | Ht 76.0 in | Wt 250.0 lb

## 2022-01-14 DIAGNOSIS — E782 Mixed hyperlipidemia: Secondary | ICD-10-CM

## 2022-01-14 DIAGNOSIS — I700 Atherosclerosis of aorta: Secondary | ICD-10-CM | POA: Diagnosis not present

## 2022-01-14 NOTE — Progress Notes (Signed)
Cardiology Office Note:    Date:  01/14/2022   ID:  Jon Byrd, DOB 1970-12-22, MRN [id number]  PCP:  Eartha Inch, MD  [facility] Cardiologist:  Donato Schultz, MD  [facility] Electrophysiologist:  None   Referring MD: Eartha Inch, MD     History of Present Illness:    Jon Byrd is a 51 y.o. male here for the follow up of hyperlipidemia and family history of early coronary artery disease.  Previously here for the evaluation of family history of coronary artery disease.  Father died 49 MI Mother's father died MI or CVA 62-60's Brother - [place], SHOCKED TWICE, EMERGENT CATH LAB, LAD stent  At his last appointment he was asymptomatic. In middle school during a physical exam he was diagnosed with sinus arrhythmia.  Explained this to him, benign.  He had slight variation in heart rate during respiration. He was being treated for hyperlipidemia with Crestor 10 mg.  Today:  He states that he has been feeling well.   He reports that he was at a college football [address] on New Year's Eve. While he was sitting, he discovered through his Apple watch that he had a consistent pulse of 138 that would not decrease. He did not feel faint. He believes dehydration was the cause of his elevated pulse. Upon relaxing back at the hotel, his pulse gradually decreased down to around 76 bpm. He denies any recurrent episodes.  His last LDL on 08/19/2020 was 60. He states that he had a more recent one that was also good.  He denies any palpitations, chest pain, shortness of breath, or peripheral edema. No lightheadedness, headaches, syncope, orthopnea, or PND.  Past Medical History:  Diagnosis Date   Dyslipidemia    [place] Study LDL Goal = < 160. NMR Lipoprofile 2008: LDL 146 (2086/1804), HDL 30, TG 149. LDL Goal= < 100    Past Surgical History:  Procedure Laterality Date   CYSTOSCOPY     age 54 for hematuria   TONSILLECTOMY     VASECTOMY  2015   Dr. Retta Diones   WISDOM TOOTH  EXTRACTION      Current Medications: Current Meds  Medication Sig   Omega-3 Fatty Acids (FISH OIL) 1000 MG CAPS Take 2,000 mg by mouth daily.   rosuvastatin (CRESTOR) 20 MG tablet TAKE 1 TABLET BY MOUTH DAILY     Allergies:   Patient has no known allergies.   Social History   Socioeconomic History   Marital status: Married    Spouse name: Not on file   Number of children: Not on file   Years of education: Not on file   Highest education level: Not on file  Occupational History   Not on file  Tobacco Use   Smoking status: Never   Smokeless tobacco: Never  Substance and Sexual Activity   Alcohol use: Yes    Comment: Socially   Drug use: No   Sexual activity: Not on file  Other Topics Concern   Not on file  Social History Narrative   Not on file   Social Determinants of Health   Financial Resource Strain: Not on file  Food Insecurity: Not on file  Transportation Needs: Not on file  Physical Activity: Not on file  Stress: Not on file  Social Connections: Not on file     Family History: The patient's family history includes Cancer in his maternal grandmother; Colon polyps in his father; Gallbladder disease in an other family member; Heart attack  in his maternal grandfather; Heart attack (age of onset: 65) in his paternal grandfather; Hypothyroidism in his mother; Osteoarthritis in his mother; Pancreatic cancer in his paternal grandmother; Stroke in his maternal grandfather. There is no history of Diabetes or Colon cancer.  ROS:   Please see the history of present illness.     All other systems reviewed and are negative.  EKGs/Labs/Other Studies Reviewed:    The following studies were reviewed today: EKG as below.  Coronary Calcium Score  05/21/2020: FINDINGS: Non-cardiac: See separate report from [facility].   Ascending Aorta: Small amount of calcification at arch   Pericardium: Normal   Coronary arteries: No coronary calcification    IMPRESSION: 1. Coronary calcium score of 0.  Low risk.   2. Mild degree of aortic arch atherosclerosis noted. Continue with statin therapy. Consider addition of aspirin 81mg  PO QD.   EKG:  EKG is personally reviewed. 01/14/2022:  Sinus rhythm. Rate 69 bpm. 05/21/2020: sinus rhythm with PVC noted.  Otherwise unremarkable.  Recent Labs: No results found for requested labs within last 365 days.  Recent Lipid Panel    Component Value Date/Time   CHOL 126 08/19/2020 0802   CHOL 214 (H) 02/05/2015 0820   TRIG 192 (H) 08/19/2020 0802   TRIG 181 (H) 02/05/2015 0820   HDL 34 (L) 08/19/2020 0802   HDL 28 (L) 02/05/2015 0820   CHOLHDL 3.7 08/19/2020 0802   LDLCALC 60 08/19/2020 0802   LDLCALC 150 (H) 02/05/2015 0820     Risk Assessment/Calculations:       Physical Exam:    VS:  BP 120/70 (BP Location: Left Arm, Patient Position: Sitting, Cuff Size: Normal)   Pulse 69   Ht 6\' 4"  (1.93 m)   Wt 250 lb (113.4 kg)   BMI 30.43 kg/m     Wt Readings from Last 3 Encounters:  01/14/22 250 lb (113.4 kg)  05/21/20 240 lb (108.9 kg)  01/08/15 226 lb (102.5 kg)     GEN:  Well nourished, well developed in no acute distress HEENT: Normal NECK: No JVD; No carotid bruits LYMPHATICS: No lymphadenopathy CARDIAC: RRR, no murmurs, rubs, gallops RESPIRATORY:  Clear to auscultation without rales, wheezing or rhonchi  ABDOMEN: Soft, non-tender, non-distended MUSCULOSKELETAL:  No edema; No deformity  SKIN: Warm and dry NEUROLOGIC:  Alert and oriented x 3 PSYCHIATRIC:  Normal affect   ASSESSMENT:    1. Aortic arch atherosclerosis (HCC)   2. Mixed hyperlipidemia     PLAN:    In order of problems listed above:  Strong family history of coronary artery disease -Calcium score was 0 in 2021.  We will repeat after 3 to 5 years. -Continue with prevention efforts.  Exercise goal 120 to 150 minutes/week. -Monitor for any signs or symptoms. -Still takes Crestor 20 mg.  LDL is excellent at  60.  PVCs -Asymptomatic.  Picked up on ECG today.  Benign.  Occasional ectopy heard.  Tachycardia - Noted while he was in 01/10/15 at [employer] game.  Could have been dehydration.  No further recurrence.  If tachycardia once again returns, we will have low threshold for a ZIO monitor.  He does have an [place].  He can obtain an EKG if necessary.  Hyperlipidemia - Crestor 20 mg.  LDL 60.  Excellent.  Reduce his overall risk.  Strong family history.   Follow-up:  1 year  Medication Adjustments/Labs and Tests Ordered: Current medicines are reviewed at length with the patient today.  Concerns regarding  medicines are outlined above.  Orders Placed This Encounter  Procedures   EKG 12-Lead   No orders of the defined types were placed in this encounter.   Patient Instructions  Medication Instructions:  The current medical regimen is effective;  continue present plan and medications.  *If you need a refill on your cardiac medications before your next appointment, please call your pharmacy*  Follow-Up: At [facility], you and your health needs are our priority.  As part of our continuing mission to provide you with exceptional heart care, we have created designated Provider Care Teams.  These Care Teams include your primary Cardiologist (physician) and Advanced Practice Providers (APPs -  Physician Assistants and Nurse Practitioners) who all work together to provide you with the care you need, when you need it.  We recommend signing up for the patient portal called "MyChart".  Sign up information is provided on this After Visit Summary.  MyChart is used to connect with patients for Virtual Visits (Telemedicine).  Patients are able to view lab/test results, encounter notes, upcoming appointments, etc.  Non-urgent messages can be sent to your provider as well.   To learn more about what you can do with MyChart, go to [email or web address].    Your next appointment:   1  year(s)  The format for your next appointment:   In Person  Provider:   Donato Schultz, MD {  Important Information About Sugar         I,Breanna Adamick,acting as a scribe for Donato Schultz, MD.,have documented all relevant documentation on the behalf of Donato Schultz, MD,as directed by  Donato Schultz, MD while in the presence of Donato Schultz, MD.  I, Donato Schultz, MD, have reviewed all documentation for this visit. The documentation on 01/14/22 for the exam, diagnosis, procedures, and orders are all accurate and complete.   Signed, Donato Schultz, MD  01/14/2022 11:19 AM    [facility]

## 2022-01-14 NOTE — Patient Instructions (Signed)

## 2022-07-12 ENCOUNTER — Other Ambulatory Visit: Payer: Self-pay | Admitting: *Deleted

## 2022-07-12 MED ORDER — ROSUVASTATIN CALCIUM 20 MG PO TABS: 20.0000 mg | ORAL_TABLET | Freq: Every day | ORAL | 1 refills | Status: DC

## 2023-01-26 ENCOUNTER — Other Ambulatory Visit: Payer: Self-pay

## 2023-01-26 MED ORDER — ROSUVASTATIN CALCIUM 20 MG PO TABS: 20.0000 mg | ORAL_TABLET | Freq: Every day | ORAL | 0 refills | Status: DC

## 2023-03-09 ENCOUNTER — Other Ambulatory Visit: Payer: Self-pay | Admitting: Nurse Practitioner

## 2023-03-09 DIAGNOSIS — R109 Unspecified abdominal pain: Secondary | ICD-10-CM

## 2023-03-23 ENCOUNTER — Ambulatory Visit
Admission: RE | Admit: 2023-03-23 | Discharge: 2023-03-23 | Disposition: A | Discharge status: Home / Self Care | Payer: BC Managed Care – PPO | Source: Ambulatory Visit | Attending: Nurse Practitioner | Admitting: Nurse Practitioner

## 2023-03-23 DIAGNOSIS — R109 Unspecified abdominal pain: Secondary | ICD-10-CM

## 2023-05-26 ENCOUNTER — Other Ambulatory Visit: Payer: Self-pay

## 2023-05-26 MED ORDER — ROSUVASTATIN CALCIUM 20 MG PO TABS: 20.0000 mg | ORAL_TABLET | Freq: Every day | ORAL | 0 refills | Status: DC

## 2023-06-09 ENCOUNTER — Other Ambulatory Visit: Payer: Self-pay | Admitting: Cardiology

## 2023-06-24 ENCOUNTER — Other Ambulatory Visit: Payer: Self-pay

## 2023-06-24 ENCOUNTER — Other Ambulatory Visit: Payer: Self-pay | Admitting: Cardiology

## 2023-09-28 ENCOUNTER — Ambulatory Visit: Payer: BC Managed Care – PPO | Attending: Cardiology | Admitting: Cardiology

## 2023-09-28 ENCOUNTER — Other Ambulatory Visit: Payer: Self-pay | Admitting: Cardiology

## 2023-09-28 ENCOUNTER — Encounter: Payer: Self-pay | Admitting: Cardiology

## 2023-09-28 VITALS — BP 128/88 | HR 78 | Ht 76.0 in | Wt 229.4 lb

## 2023-09-28 DIAGNOSIS — E782 Mixed hyperlipidemia: Secondary | ICD-10-CM

## 2023-09-28 DIAGNOSIS — I700 Atherosclerosis of aorta: Secondary | ICD-10-CM

## 2023-09-28 MED ORDER — ROSUVASTATIN CALCIUM 10 MG PO TABS: 10.0000 mg | ORAL_TABLET | Freq: Every day | ORAL | 3 refills | Status: AC

## 2023-09-28 NOTE — Patient Instructions (Signed)
 Medication Instructions:  Please decrease your Crestor to 10 mg a day. Continue all other medications as listed.  *If you need a refill on your cardiac medications before your next appointment, please call your pharmacy*  Testing/Procedures: Your physician has requested that you have a Coronary Calcium score which is completed by CT in 1 yr. Cardiac computed tomography (CT) is a painless test that uses an x-ray machine to take clear, detailed pictures of your heart. There are no instructions for this testing.  You may eat/drink and take your normal medications this day.  The cost of the testing is $99 due at the time of your appointment. You will be contacted at a later date to be scheduled for this testing.   Follow-Up: At [facility], you and your health needs are our priority.  As part of our continuing mission to provide you with exceptional heart care, our providers are all part of one team.  This team includes your primary Cardiologist (physician) and Advanced Practice Providers or APPs (Physician Assistants and Nurse Practitioners) who all work together to provide you with the care you need, when you need it.  Your next appointment:   1 year(s)  Provider:   Dorothye Gathers, MD    We recommend signing up for the patient portal called "MyChart".  Sign up information is provided on this After Visit Summary.  MyChart is used to connect with patients for Virtual Visits (Telemedicine).  Patients are able to view lab/test results, encounter notes, upcoming appointments, etc.  Non-urgent messages can be sent to your provider as well.   To learn more about what you can do with MyChart, go to [email or web address].      1st Floor: - Lobby - Registration  - Pharmacy  - Lab - Cafe  2nd Floor: - PV Lab - Diagnostic Testing (echo, CT, nuclear med)  3rd Floor: - Vacant  4th Floor: - TCTS (cardiothoracic surgery) - AFib Clinic - Structural Heart Clinic - Vascular Surgery   - Vascular Ultrasound  5th Floor: - HeartCare Cardiology (general and EP) - Clinical Pharmacy for coumadin, hypertension, lipid, weight-loss medications, and med management appointments    Valet parking services will be available as well.

## 2023-09-28 NOTE — Progress Notes (Signed)
 Cardiology Office Note:  .   Date:  09/28/2023  ID:  Jon Byrd, DOB [birth year], MRN [id number] PCP: Emaline Handsome, MD  [facility] Providers Cardiologist:  Dorothye Gathers, MD     History of Present Illness: .   Jon Byrd is a 53 y.o. male Discussed the use of AI scribe software for clinical note transcription with the patient, who gave verbal consent to proceed.  History of Present Illness Jon Shad "Wandalee Gust" is a 53 year old male with hyperlipidemia and a family history of coronary artery disease who presents for a follow-up visit.  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 He is currently treated for hyperlipidemia with rosuvastatin 20 mg daily and omega-3 fish oil. His current LDL is 60. He recalls a past episode where his Apple watch showed a consistent pulse of 138 that did not decrease initially but returned to 76 upon relaxation, which he attributed to dehydration.  He has a significant family history of early coronary artery disease and hyperlipidemia. His father died at age 66 from a myocardial infarction, and his maternal grandfather died of myocardial infarction or stroke in his fifties to sixties. His brother experienced ventricular tachycardia, required shocks twice, and had an emergent catheterization with a stent placed in the left anterior descending artery.  He had a coronary calcium score of zero in 2021 and has a known mild degree of aortic arteriosclerosis. Previous premature ventricular contractions were noted on ECG and deemed benign.  He wants to decrease his Crestor dosage due to concerns about potential increases in glucose levels and reports experiencing increased 'brain fog' since the dosage increase. No chest pain, fevers, chills, nausea, vomiting, or shortness of breath.       Studies Reviewed: Aaron Aas   EKG Interpretation Date/Time:  Wednesday September 28 2023 09:07:34 EDT Ventricular Rate:   78 PR Interval:  134 QRS Duration:  76 QT Interval:  326 QTC Calculation: 371 R Axis:   44  Text Interpretation: Normal sinus rhythm Normal ECG No previous ECGs available Confirmed by Dorothye Gathers ([id number]) on 09/28/2023 9:11:42 AM    Results LABS LDL: 60 mg/dL  RADIOLOGY Coronary calcium score: 0 (2021)  DIAGNOSTIC EKG: Normal (09/28/2023) Risk Assessment/Calculations:            Physical Exam:   VS:  BP 128/88   Pulse 78   Ht 6\' 4"  (1.93 m)   Wt 229 lb 6.4 oz (104.1 kg)   SpO2 97%   BMI 27.92 kg/m    Wt Readings from Last 3 Encounters:  09/28/23 229 lb 6.4 oz (104.1 kg)  01/14/22 250 lb (113.4 kg)  05/21/20 240 lb (108.9 kg)    GEN: Well nourished, well developed in no acute distress NECK: No JVD; No carotid bruits CARDIAC: RRR, no murmurs, no rubs, no gallops RESPIRATORY:  Clear to auscultation without rales, wheezing or rhonchi  ABDOMEN: Soft, non-tender, non-distended EXTREMITIES:  No edema; No deformity   ASSESSMENT AND PLAN: .    Assessment and Plan Assessment & Plan Mixed hyperlipidemia He is on rosuvastatin 20 mg daily with an LDL of 60 mg/dL. He expressed concerns about potential increases in glucose levels and brain fog associated with the higher dose. While higher doses of statins can slightly increase glucose levels, the cardiovascular benefits generally outweigh these risks. However, given his concerns and well-managed LDL levels, the decision was made to reduce the rosuvastatin dose to 10 mg daily. The incremental  benefit of Crestor outweighs the subtle increase in glucose levels, but reducing the dose may alleviate his symptoms of brain fog. - Decrease rosuvastatin to 10 mg daily - Continue omega-3 fatty acids 2000 mg daily  Aortic arch atherosclerosis He has mild aortic arteriosclerosis. A coronary calcium score of zero in 2021 indicated no significant calcified plaque at that time. Despite the zero calcium score, this does not rule out the presence  of soft plaque. Given his strong family history of coronary artery disease, repeating the coronary calcium score in 2026, five years after the last test, is recommended to monitor for any changes. The test is cost-effective at $99 and provides valuable information about potential plaque development. - Schedule coronary calcium score in 2026  Family history of cardiovascular disease He has a significant family history of cardiovascular disease, including early coronary artery disease and myocardial infarctions. His brother had ventricular tachycardia and required an LAD stent. This family history increases his risk for cardiovascular events, which informs the decision to continue statin therapy despite the reduction in dose.  Premature Ventricular Contractions (PVCs) Previously noted PVCs on ECG were benign. His current ECG shows a normal rhythm with no issues. He remains asymptomatic with no chest pain, shortness of breath, or other concerning symptoms.  Tachycardia He reported a single episode of tachycardia with a pulse of 138 bpm, which resolved with relaxation. He attributed this to possible dehydration. There is no concern about this isolated incident, especially given the normal ECG findings during the visit.  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 There are no current symptoms or concerns related to this condition.          Signed, Dorothye Gathers, MD

## 2024-06-22 ENCOUNTER — Other Ambulatory Visit (HOSPITAL_BASED_OUTPATIENT_CLINIC_OR_DEPARTMENT_OTHER): Payer: Self-pay

## 2024-06-22 MED ORDER — FLUZONE 0.5 ML IM SUSY
0.5000 mL | PREFILLED_SYRINGE | Freq: Once | INTRAMUSCULAR | 0 refills | Status: AC
  Filled 2024-06-22: qty 0.5, 1d supply, fill #0
